# Patient Record
Sex: Male | Born: 1960 | Race: White | Hispanic: No | Marital: Married | Smoking: Never smoker
Health system: Southern US, Community
[De-identification: ages and names within clinical notes are randomized; demographics above are authoritative.]

---

## 2020-03-25 ENCOUNTER — Encounter (HOSPITAL_COMMUNITY): Payer: Self-pay

## 2020-03-25 ENCOUNTER — Emergency Department (HOSPITAL_COMMUNITY): Payer: Self-pay

## 2020-03-25 ENCOUNTER — Emergency Department (HOSPITAL_COMMUNITY)
Admission: EM | Admit: 2020-03-25 | Discharge: 2020-03-26 | Disposition: A | Discharge status: Home / Self Care | Payer: Self-pay | Attending: Emergency Medicine | Admitting: Emergency Medicine

## 2020-03-25 ENCOUNTER — Other Ambulatory Visit: Payer: Self-pay

## 2020-03-25 DIAGNOSIS — Z5321 Procedure and treatment not carried out due to patient leaving prior to being seen by health care provider: Secondary | ICD-10-CM | POA: Insufficient documentation

## 2020-03-25 DIAGNOSIS — K469 Unspecified abdominal hernia without obstruction or gangrene: Secondary | ICD-10-CM | POA: Insufficient documentation

## 2020-03-25 NOTE — ED Triage Notes (Signed)
Pt c/o lower abd pain bilaterally. Pt states he believes he has had a hernia for a while but it has not bothered him until recently. Pt denies N/V/D.

## 2021-04-23 IMAGING — CR DG ABDOMEN 1V
1 series · 1 of 1 positions shown · non-contrast
Comparison: None.

CLINICAL DATA: Hernia, worsening

EXAM:
ABDOMEN - 1 VIEW

[t abdomen supine]
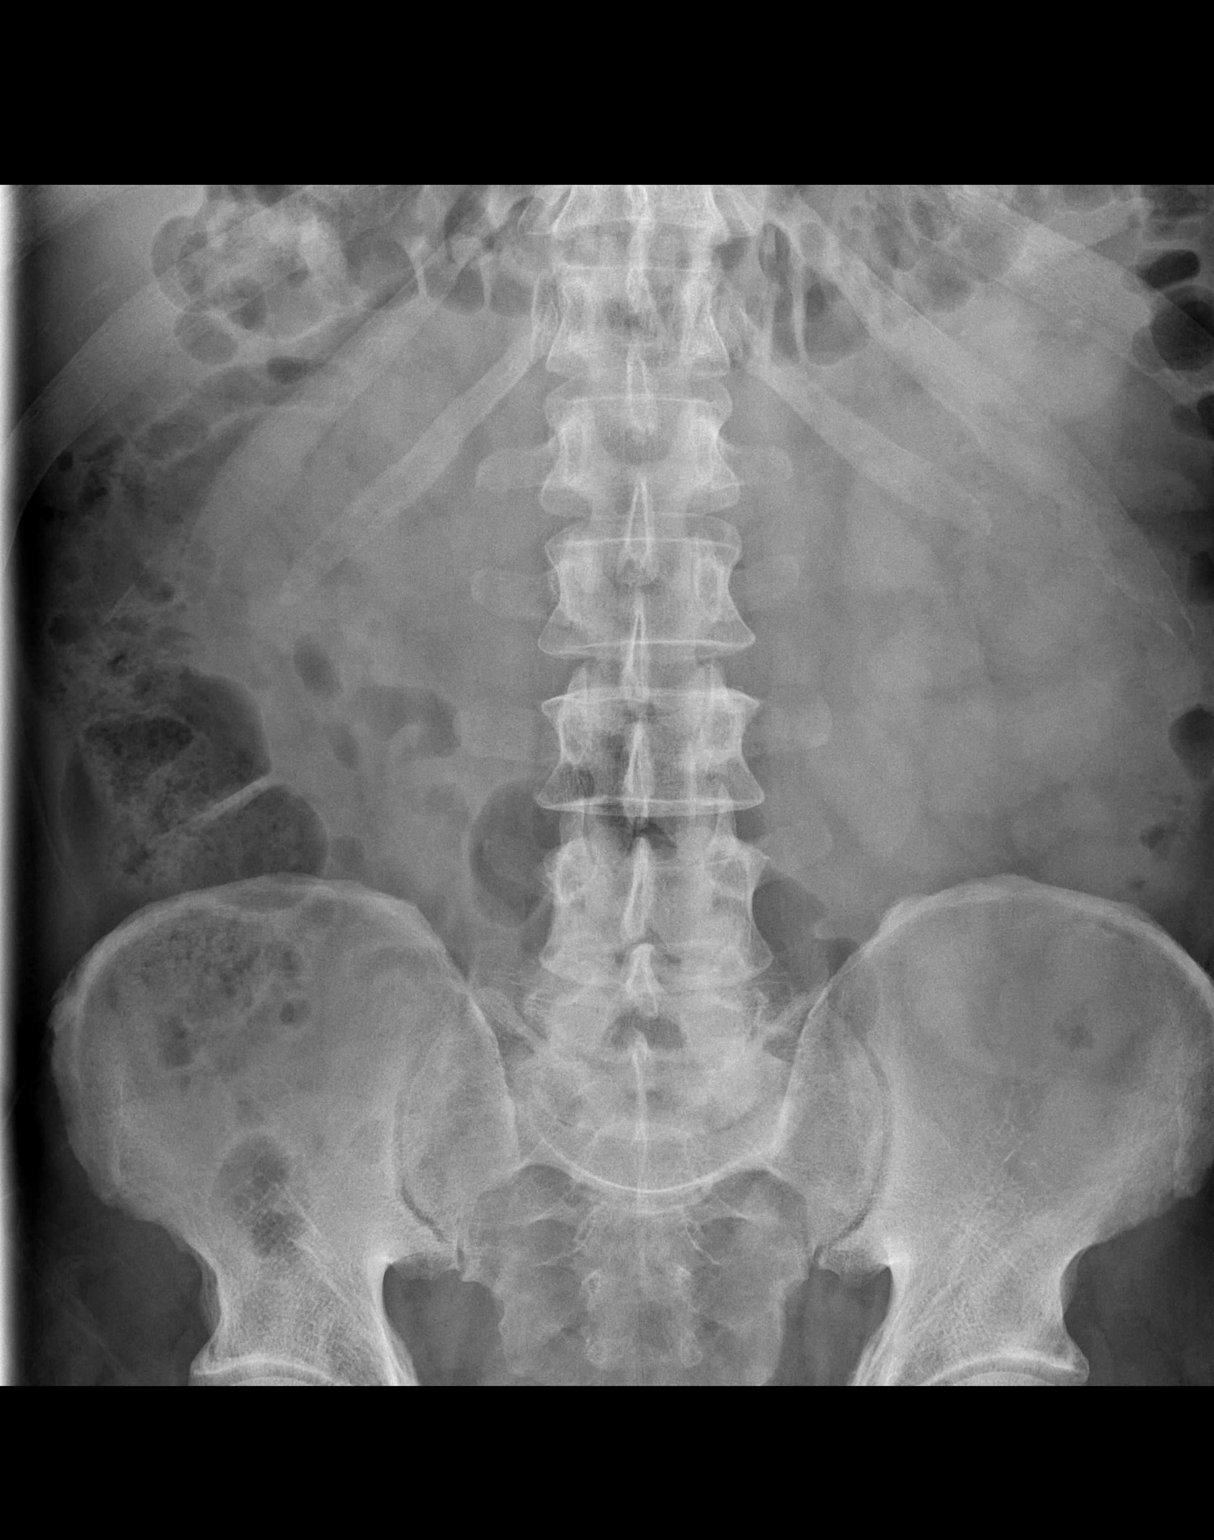

[1 of 1 positions shown; findings below may reference images not displayed]

FINDINGS: Limited assessment of the patient's known umbilical hernia on this
frontal only radiograph. No high-grade obstructive bowel gas pattern
is seen. No suspicious calcifications. Evaluation for free air
limited on supine only view. No acute or suspicious osseous
abnormalities. Minimal degenerative changes about the hips and
pelvis and lower lumbar levels.
IMPRESSION: Limited assessment of the patient's known umbilical hernia on this
frontal only radiograph. No high-grade obstructive bowel gas
pattern.

## 2021-12-04 ENCOUNTER — Other Ambulatory Visit: Payer: Self-pay

## 2021-12-04 ENCOUNTER — Encounter (HOSPITAL_BASED_OUTPATIENT_CLINIC_OR_DEPARTMENT_OTHER): Payer: Self-pay

## 2021-12-04 ENCOUNTER — Emergency Department (HOSPITAL_BASED_OUTPATIENT_CLINIC_OR_DEPARTMENT_OTHER)
Admission: EM | Admit: 2021-12-04 | Discharge: 2021-12-04 | Disposition: A | Discharge status: Home / Self Care | Payer: 59 | Attending: Emergency Medicine | Admitting: Emergency Medicine

## 2021-12-04 DIAGNOSIS — R1030 Lower abdominal pain, unspecified: Secondary | ICD-10-CM | POA: Diagnosis present

## 2021-12-04 DIAGNOSIS — X500XXA Overexertion from strenuous movement or load, initial encounter: Secondary | ICD-10-CM | POA: Insufficient documentation

## 2021-12-04 DIAGNOSIS — K4091 Unilateral inguinal hernia, without obstruction or gangrene, recurrent: Secondary | ICD-10-CM | POA: Insufficient documentation

## 2021-12-04 MED ORDER — OXYCODONE HCL 5 MG PO TABS: 5.0000 mg | ORAL_TABLET | Freq: Four times a day (QID) | ORAL | 0 refills | Status: AC | PRN

## 2021-12-04 MED ORDER — OXYCODONE HCL 5 MG PO TABS
5.0000 mg | ORAL_TABLET | Freq: Once | ORAL | Status: AC
  Administered 2021-12-04: 5 mg via ORAL
  Filled 2021-12-04: qty 1

## 2021-12-04 NOTE — ED Provider Notes (Signed)
?[facility] ?Provider Note ? ? ?CSN: [id number] ?Arrival date & time: 12/04/21  2158 ? ?  ? ?History ? ?Chief Complaint  ?Patient presents with  ? Groin Pain  ? ? ?Reason Helzer is a 61 y.o. male. ? ?Patient states that his hernia is been having more discomfort today.  Has not been able to fully get it to go back in.  Started after lifting a heavy object.  Denies any pain with urination.  Denies any problems with bowel movement or passing gas. ? ?The history is provided by the patient.  ?Groin Pain ?This is a recurrent problem. The current episode started 3 to 5 hours ago. The problem occurs constantly. The problem has not changed since onset.Pertinent negatives include no chest pain, no abdominal pain, no headaches and no shortness of breath. Nothing aggravates the symptoms. Nothing relieves the symptoms. He has tried nothing for the symptoms. The treatment provided no relief.  ? ?  ? ?Home Medications ?Prior to Admission medications   ?Medication Sig Start Date End Date Taking? Authorizing Provider  ?oxyCODONE (ROXICODONE) 5 MG immediate release tablet Take 1 tablet (5 mg total) by mouth every 6 (six) hours as needed for up to 10 doses for severe pain or breakthrough pain. 12/04/21  Yes Haston Casebolt, DO  ?   ? ?Allergies    ?Patient has no known allergies.   ? ?Review of Systems   ?Review of Systems  ?Respiratory:  Negative for shortness of breath.   ?Cardiovascular:  Negative for chest pain.  ?Gastrointestinal:  Negative for abdominal pain.  ?Neurological:  Negative for headaches.  ? ?Physical Exam ?Updated Vital Signs ?BP 128/75 (BP Location: Right Arm)   Pulse 68   Temp 97.9 ?F (36.6 ?C) (Oral)   Resp 12   Ht 5' 10.5" (1.791 m)   Wt 95.3 kg   SpO2 98%   BMI 29.72 kg/m?  ?Physical Exam ?Vitals and nursing note reviewed.  ?Constitutional:   ?   General: He is not in acute distress. ?   Appearance: He is well-developed. He is not ill-appearing.  ?HENT:  ?   Head:  Normocephalic and atraumatic.  ?Eyes:  ?   Conjunctiva/sclera: Conjunctivae normal.  ?Cardiovascular:  ?   Rate and Rhythm: Normal rate and regular rhythm.  ?   Pulses: Normal pulses.  ?   Heart sounds: No murmur heard. ?Pulmonary:  ?   Effort: Pulmonary effort is normal. No respiratory distress.  ?   Breath sounds: Normal breath sounds.  ?Abdominal:  ?   Palpations: Abdomen is soft.  ?   Tenderness: There is no abdominal tenderness.  ?Genitourinary: ?   Comments: Patient with left inguinal hernia that was reduced at the bedside ?Musculoskeletal:     ?   General: No swelling.  ?   Cervical back: Neck supple.  ?Skin: ?   General: Skin is warm and dry.  ?   Capillary Refill: Capillary refill takes less than 2 seconds.  ?Neurological:  ?   General: No focal deficit present.  ?   Mental Status: He is alert.  ?Psychiatric:     ?   Mood and Affect: Mood normal.  ? ? ?ED Results / Procedures / Treatments   ?Labs ?(all labs ordered are listed, but only abnormal results are displayed) ?Labs Reviewed - No data to display ? ?EKG ?None ? ?Radiology ?No results found. ? ?Procedures ?Procedures  ? ? ?Medications Ordered in ED ?Medications  ?oxyCODONE (Oxy IR/ROXICODONE)  immediate release tablet 5 mg (5 mg Oral Given 12/04/21 2235)  ? ? ?ED Course/ Medical Decision Making/ A&P ?  ?                        ?Medical Decision Making ?Risk ?Prescription drug management. ? ? ?Lorenso Quirino is here with left-sided groin pain.  Has recurrent inguinal hernia that sticks out at times.  I was able to reduce left inguinal hernia without any issues.  No overlying skin changes.  Overall normal abdominal exam afterward.  Normal testicular exam.  Pain improved.  Overall we will refer to general surgery.  Recommend Tylenol and ibuprofen for breakthrough pain I will prescribe oxycodone.  Educated him about how to get hernia back in.  Recommend MiraLAX as needed for constipation.  Recommend avoiding any heavy lifting.  Discharged in good  condition.  Neurovascular neuromuscular intact. ? ?This chart was dictated using voice recognition software.  Despite best efforts to proofread,  errors can occur which can change the documentation meaning.  ? ? ? ? ? ? ? ?Final Clinical Impression(s) / ED Diagnoses ?Final diagnoses:  ?Unilateral recurrent inguinal hernia without obstruction or gangrene  ? ? ?Rx / DC Orders ?ED Discharge Orders   ? ?      Ordered  ?  oxyCODONE (ROXICODONE) 5 MG immediate release tablet  Every 6 hours PRN       ? 12/04/21 2235  ? ?  ?  ? ?  ? ? ?  ?Virgina Norfolk, DO ?12/04/21 2238 ? ?

## 2021-12-04 NOTE — ED Triage Notes (Signed)
Patient BIB [facility] from Home with Groin Pain. ? ?Endorses Pain has been present Intermittently for 1 Year. States today he was moving a Heavy Countertop and endorses Pain to Same Area that was Severe.  ? ?Also states he was in the Bathroom today when he felt a Sudden Wave of Lightheadedness that has now subsided mostly.  ? ?NAD Noted during Triage. A&Ox4. GCS 15. Ambulatory. ?
